# Patient Record
Sex: Male | Born: 1995 | Race: White | Hispanic: No | Marital: Single | Smoking: Never smoker
Health system: Southern US, Community
[De-identification: ages and names within clinical notes are randomized; demographics above are authoritative.]

---
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
## 2017-05-08 IMAGING — CR DG FOOT COMPLETE 3+V*R*
3 series · 3 of 3 positions shown · non-contrast
Comparison: October 07, 2007

CLINICAL DATA: Pain following twisting injury while playing
basketball

EXAM:
RIGHT FOOT COMPLETE - 3+ VIEW

[AP]
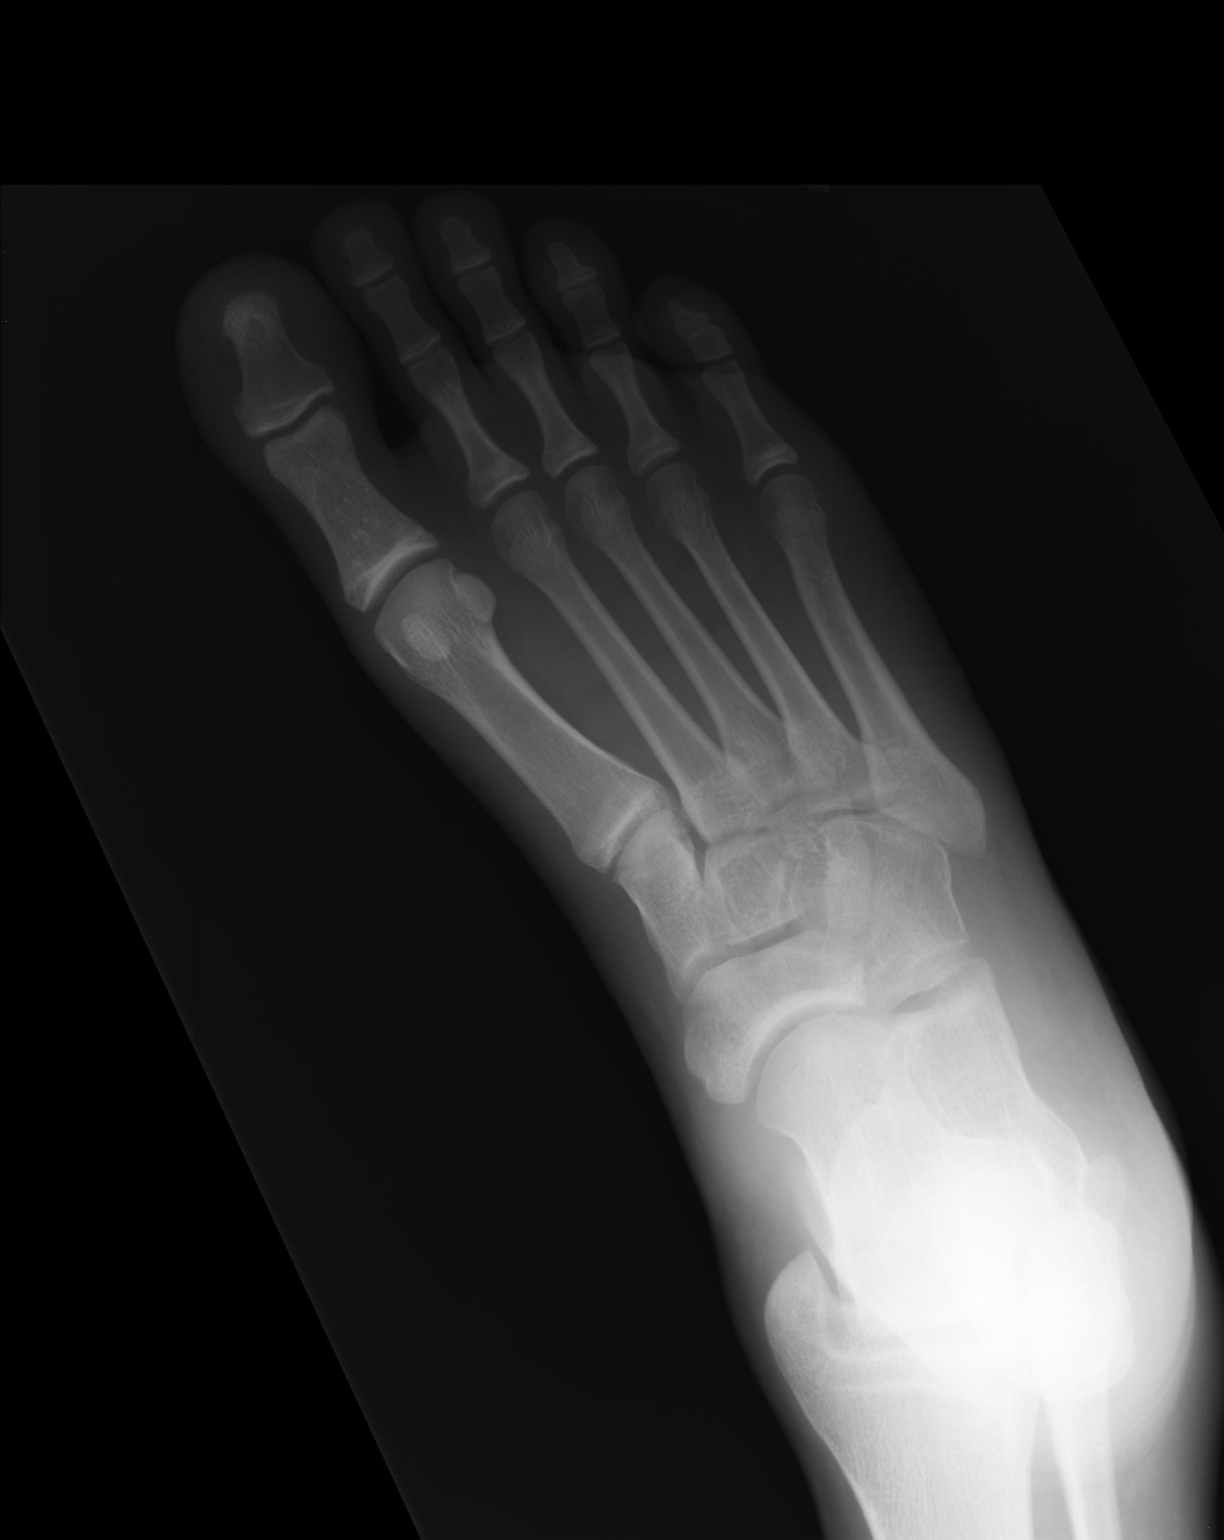

[ap obl int rot]
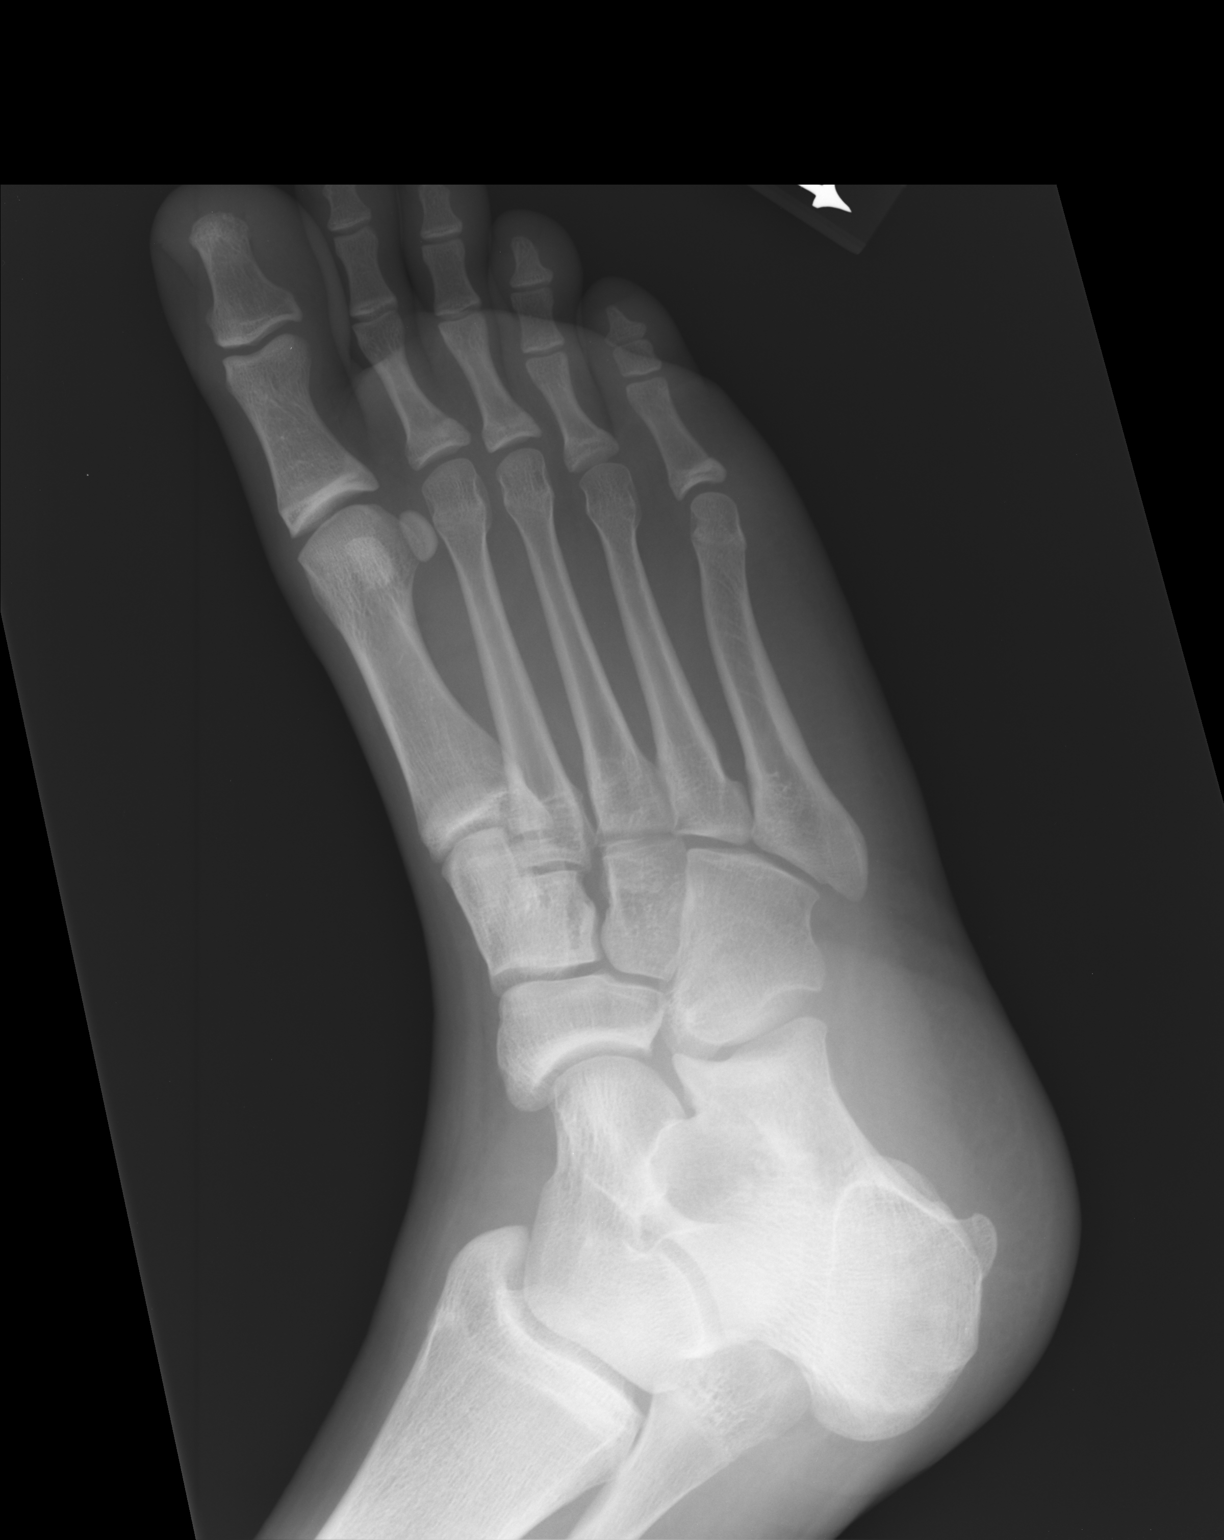

[lateral]
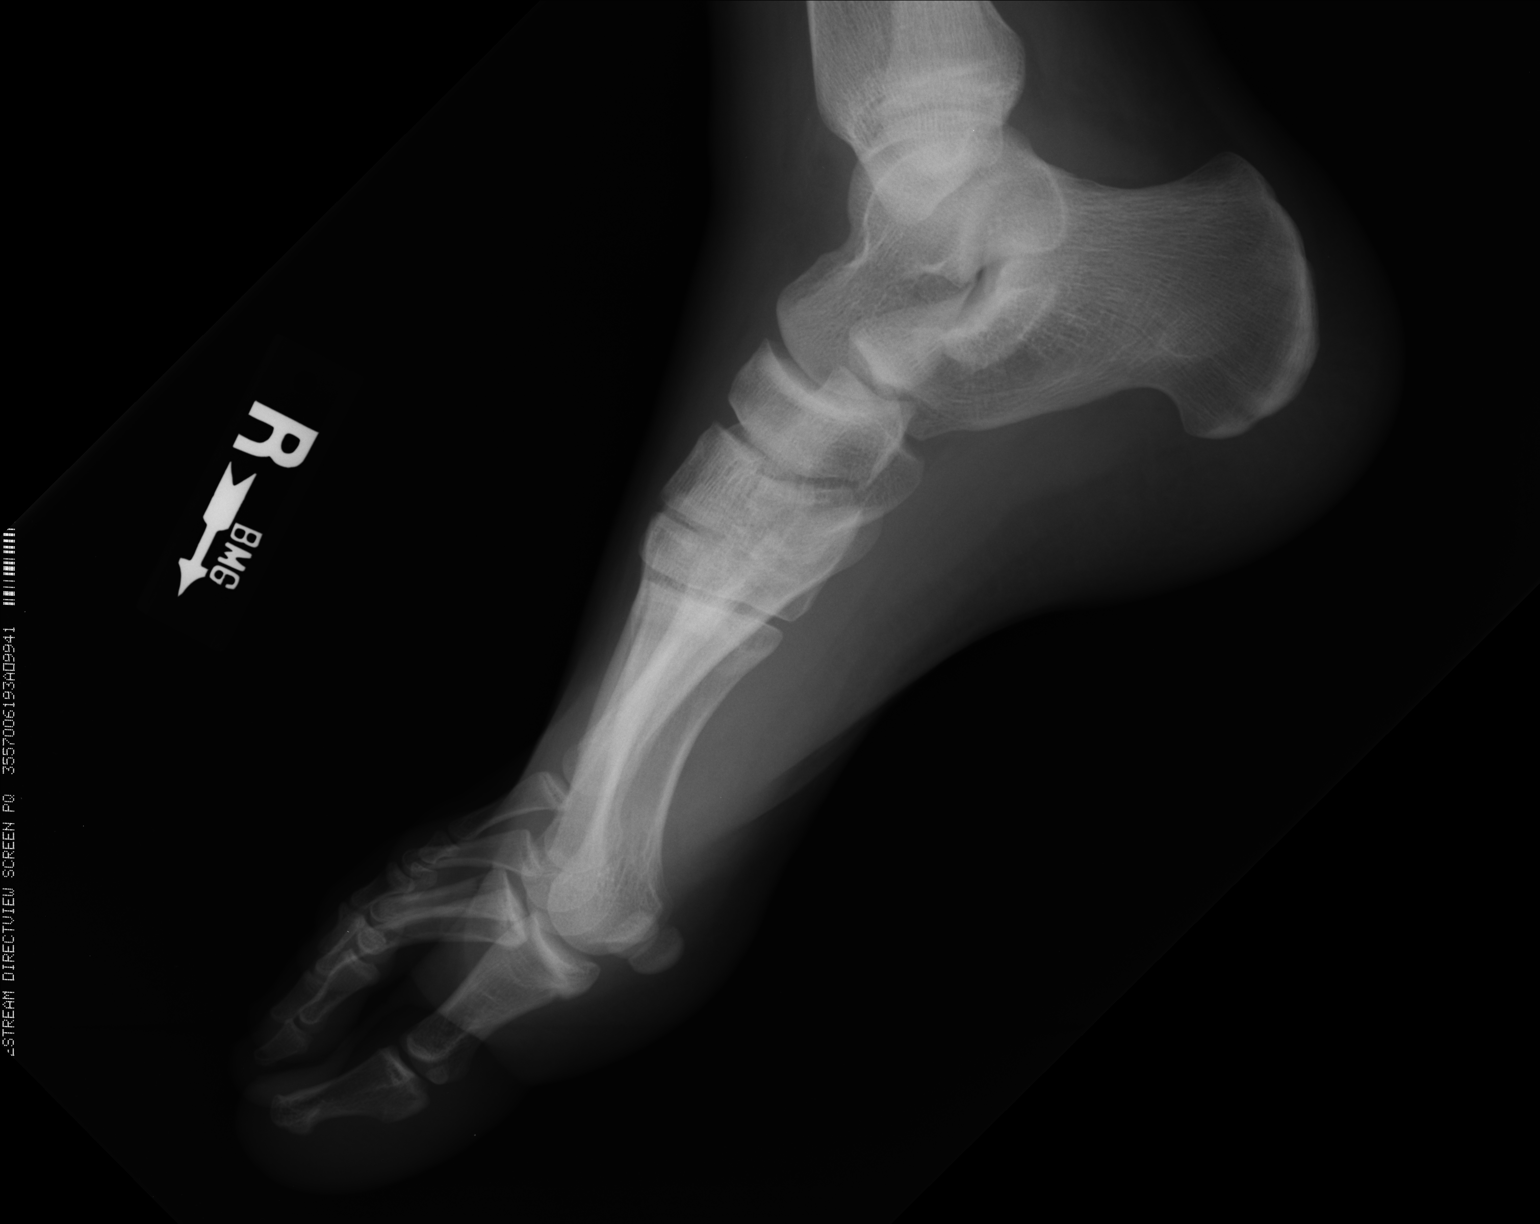

[3 of 3 positions shown; findings below may reference images not displayed]

FINDINGS: Frontal, oblique, and lateral views were obtained. There is no
demonstrable fracture or dislocation. The joint spaces appear
normal. No erosive change.
IMPRESSION: No fracture or dislocation.  No appreciable arthropathy.

## 2018-09-18 DIAGNOSIS — Z20828 Contact with and (suspected) exposure to other viral communicable diseases: Secondary | ICD-10-CM | POA: Diagnosis not present

## 2019-03-24 ENCOUNTER — Other Ambulatory Visit: Payer: Self-pay

## 2019-03-24 ENCOUNTER — Ambulatory Visit: Payer: BC Managed Care – PPO | Attending: Internal Medicine

## 2019-03-24 DIAGNOSIS — Z20822 Contact with and (suspected) exposure to covid-19: Secondary | ICD-10-CM

## 2019-03-24 DIAGNOSIS — U071 COVID-19: Secondary | ICD-10-CM | POA: Insufficient documentation

## 2019-03-25 LAB — NOVEL CORONAVIRUS, NAA: SARS-CoV-2, NAA: DETECTED — AB

## 2019-08-21 ENCOUNTER — Other Ambulatory Visit: Payer: Self-pay

## 2019-08-21 DIAGNOSIS — Z20822 Contact with and (suspected) exposure to covid-19: Secondary | ICD-10-CM | POA: Diagnosis not present

## 2019-08-22 LAB — SARS-COV-2, NAA 2 DAY TAT

## 2019-08-22 LAB — NOVEL CORONAVIRUS, NAA: SARS-CoV-2, NAA: NOT DETECTED

## 2019-09-11 ENCOUNTER — Ambulatory Visit: Payer: Self-pay

## 2019-09-11 ENCOUNTER — Ambulatory Visit: Payer: Self-pay | Attending: Internal Medicine

## 2019-09-11 DIAGNOSIS — Z23 Encounter for immunization: Secondary | ICD-10-CM

## 2019-09-11 NOTE — Progress Notes (Signed)
   Covid-19 Vaccination Clinic  Name:  Gregory Patel    MRN: [id number] DOB: 1996-01-02  09/11/2019  Mr. Austad was observed post Covid-19 immunization for 15 minutes without incident. He was provided with Vaccine Information Sheet and instruction to access the V-Safe system.   Mr. Coran was instructed to call 911 with any severe reactions post vaccine: Marland Kitchen Difficulty breathing  . Swelling of face and throat  . A fast heartbeat  . A bad rash all over body  . Dizziness and weakness   Immunizations Administered    Name Date Dose VIS Date Route   Pfizer COVID-19 Vaccine 09/11/2019 10:01 AM 0.3 mL 03/19/2018 Intramuscular   Manufacturer: [employer], [employer]   Lot: [id number]   NDC: [address]-7

## 2019-11-07 DIAGNOSIS — M79674 Pain in right toe(s): Secondary | ICD-10-CM | POA: Diagnosis not present

## 2019-11-07 DIAGNOSIS — L600 Ingrowing nail: Secondary | ICD-10-CM | POA: Diagnosis not present

## 2020-03-26 DIAGNOSIS — L600 Ingrowing nail: Secondary | ICD-10-CM | POA: Diagnosis not present

## 2020-08-02 ENCOUNTER — Telehealth: Payer: Self-pay | Admitting: Family Medicine

## 2020-08-02 NOTE — Telephone Encounter (Signed)
Pt states he wants plain tetanus vaccine. No cuts. Just states it has been 10 years since last one. Pt does not need a call back if dr scott if ok with signing script. I let pt know it would be this evening before he signed and would call back if any issues. Script on door to sign if you agree

## 2020-08-02 NOTE — Telephone Encounter (Signed)
Was signed thank you 

## 2020-08-02 NOTE — Telephone Encounter (Signed)
Pt is requesting a Tetanus shot sent to  [facility] - [place], [place] - [address]
